# Patient Record
Sex: Female | Born: 1955 | Race: Black or African American | Hispanic: No | Marital: Single | Smoking: Former smoker
Health system: Southern US, Community
[De-identification: ages and names within clinical notes are randomized; demographics above are authoritative.]

---

## 2019-03-08 ENCOUNTER — Emergency Department (INDEPENDENT_AMBULATORY_CARE_PROVIDER_SITE_OTHER): Payer: PRIVATE HEALTH INSURANCE

## 2019-03-08 ENCOUNTER — Other Ambulatory Visit: Payer: Self-pay

## 2019-03-08 ENCOUNTER — Emergency Department (INDEPENDENT_AMBULATORY_CARE_PROVIDER_SITE_OTHER)
Admission: EM | Admit: 2019-03-08 | Discharge: 2019-03-08 | Disposition: A | Discharge status: Home / Self Care | Payer: PRIVATE HEALTH INSURANCE | Source: Home / Self Care | Attending: Family Medicine | Admitting: Family Medicine

## 2019-03-08 DIAGNOSIS — S52502A Unspecified fracture of the lower end of left radius, initial encounter for closed fracture: Secondary | ICD-10-CM

## 2019-03-08 DIAGNOSIS — W19XXXA Unspecified fall, initial encounter: Secondary | ICD-10-CM | POA: Diagnosis not present
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
## 2019-03-08 NOTE — Discharge Instructions (Addendum)
Wear splint.  Elevate arm/wrist.  Apply ice pack for 20 to 30 minutes, 3 to 4 times daily.  May take Tylenol as needed for pain.

## 2019-03-10 ENCOUNTER — Ambulatory Visit (INDEPENDENT_AMBULATORY_CARE_PROVIDER_SITE_OTHER): Payer: PRIVATE HEALTH INSURANCE | Admitting: Sports Medicine

## 2019-03-10 ENCOUNTER — Encounter: Payer: Self-pay | Admitting: Sports Medicine

## 2019-03-10 ENCOUNTER — Other Ambulatory Visit: Payer: Self-pay

## 2019-03-10 DIAGNOSIS — S52552A Other extraarticular fracture of lower end of left radius, initial encounter for closed fracture: Secondary | ICD-10-CM | POA: Diagnosis not present
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
## 2019-03-10 NOTE — Assessment & Plan Note (Signed)
Transitioning into an XO's cast. She does have some swelling so we may need to remove this at a follow-up visit. Return to see me in 1 week, repeat x-rays before visit.  I billed a fracture code for this encounter, all subsequent visits will be post-op checks in the global period.

## 2019-03-17 ENCOUNTER — Other Ambulatory Visit: Payer: Self-pay

## 2019-03-17 ENCOUNTER — Encounter: Payer: Self-pay | Admitting: Family Medicine

## 2019-03-17 ENCOUNTER — Ambulatory Visit (INDEPENDENT_AMBULATORY_CARE_PROVIDER_SITE_OTHER): Payer: PRIVATE HEALTH INSURANCE

## 2019-03-17 ENCOUNTER — Ambulatory Visit (INDEPENDENT_AMBULATORY_CARE_PROVIDER_SITE_OTHER): Payer: PRIVATE HEALTH INSURANCE | Admitting: Family Medicine

## 2019-03-17 VITALS — BP 158/79 | HR 78 | Temp 98.2°F | Ht 67.0 in | Wt 162.0 lb

## 2019-03-17 DIAGNOSIS — S52552A Other extraarticular fracture of lower end of left radius, initial encounter for closed fracture: Secondary | ICD-10-CM

## 2019-03-17 DIAGNOSIS — Z780 Asymptomatic menopausal state: Secondary | ICD-10-CM
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
## 2019-03-17 NOTE — Patient Instructions (Addendum)
Thank you for coming in today. Continue the exos cast.  Recheck in 2 weeks with Dr T or myself.  Schedule bone density test prior to the scheduled follow up appointment.  Usually 1 hour is plenty.  Get xray prior to next visit as well.   Phone number for xray scheduling is [phone]

## 2019-04-01 ENCOUNTER — Ambulatory Visit (INDEPENDENT_AMBULATORY_CARE_PROVIDER_SITE_OTHER): Payer: PRIVATE HEALTH INSURANCE

## 2019-04-01 ENCOUNTER — Other Ambulatory Visit: Payer: Self-pay

## 2019-04-01 ENCOUNTER — Ambulatory Visit (INDEPENDENT_AMBULATORY_CARE_PROVIDER_SITE_OTHER): Payer: PRIVATE HEALTH INSURANCE | Admitting: Sports Medicine

## 2019-04-01 DIAGNOSIS — M810 Age-related osteoporosis without current pathological fracture: Secondary | ICD-10-CM

## 2019-04-01 DIAGNOSIS — Z780 Asymptomatic menopausal state: Secondary | ICD-10-CM

## 2019-04-01 DIAGNOSIS — S52552A Other extraarticular fracture of lower end of left radius, initial encounter for closed fracture: Secondary | ICD-10-CM | POA: Diagnosis not present

## 2019-04-01 NOTE — Assessment & Plan Note (Signed)
T score of -2.3. Combined with the fracture I would say this is low enough to diagnose her with osteoporosis, we are going to see if she be okay starting Fosamax.

## 2019-04-01 NOTE — Progress Notes (Addendum)
  Subjective: 3 weeks post left distal radius fracture, doing well.  Objective: General: Well-developed, well-nourished, and in no acute distress. Left wrist: Swollen, no longer tender, good motion, good strength.  X-rays reviewed, there is minimal comminution in the distal radius fracture, the radial height has settled a bit however she continues to have good volar tilt and inclination.  Assessment/plan:   Closed extraarticular fracture of distal radius, left, initial encounter Doing well, return in 3 weeks. Continue EXOS cast until then. X-rays before visit. At the 3-week point we will discontinue the cast and get her working on range of motion exercises.  Osteoporosis T score of -2.3. Combined with the fracture I would say this is low enough to diagnose her with osteoporosis, we are going to see if she be okay starting Fosamax.    ___________________________________________ Gwen Her. Dianah Field, M.D., ABFM., CAQSM. Primary Care and Sports Medicine [facility]  Adjunct Professor of [place] [employer]

## 2019-04-01 NOTE — Assessment & Plan Note (Signed)
Doing well, return in 3 weeks. Continue EXOS cast until then. X-rays before visit. At the 3-week point we will discontinue the cast and get her working on range of motion exercises.

## 2019-04-03 MED ORDER — ALENDRONATE SODIUM 70 MG PO TABS: 70.0000 mg | ORAL_TABLET | ORAL | 11 refills | Status: DC

## 2019-04-03 NOTE — Addendum Note (Signed)
Addended by: Silverio Decamp on: 04/03/2019 08:45 AM   Modules accepted: Orders

## 2019-04-22 ENCOUNTER — Encounter: Payer: Self-pay | Admitting: Sports Medicine

## 2019-04-22 ENCOUNTER — Ambulatory Visit (INDEPENDENT_AMBULATORY_CARE_PROVIDER_SITE_OTHER): Payer: PRIVATE HEALTH INSURANCE

## 2019-04-22 ENCOUNTER — Ambulatory Visit (INDEPENDENT_AMBULATORY_CARE_PROVIDER_SITE_OTHER): Payer: PRIVATE HEALTH INSURANCE | Admitting: Sports Medicine

## 2019-04-22 ENCOUNTER — Other Ambulatory Visit: Payer: Self-pay

## 2019-04-22 DIAGNOSIS — S52552A Other extraarticular fracture of lower end of left radius, initial encounter for closed fracture: Secondary | ICD-10-CM

## 2019-04-22 DIAGNOSIS — M810 Age-related osteoporosis without current pathological fracture: Secondary | ICD-10-CM

## 2019-04-22 MED ORDER — ALENDRONATE SODIUM 70 MG PO TABS: 70.0000 mg | ORAL_TABLET | ORAL | 11 refills | Status: DC

## 2019-04-22 NOTE — Progress Notes (Signed)
  Subjective: 6 weeks post fracture, doing well.  Objective: General: Well-developed, well-nourished, and in no acute distress. Left wrist: No longer tender over the fracture, still somewhat swollen and expected loss of range of motion.  Assessment/plan:   Closed extraarticular fracture of distal radius, left, initial encounter Now approximately 6 weeks post fracture, there was a bit of loss of radial height but inclination and volar tilt were still good. Clinically she looks good, no pain over the fracture, discontinue cast. Adding formal physical therapy. Return to see me in 4 weeks.    ___________________________________________ Gwen Her. Dianah Field, M.D., ABFM., CAQSM. Primary Care and Sports Medicine [facility]  Adjunct Professor of [place] [employer]

## 2019-04-22 NOTE — Assessment & Plan Note (Signed)
Now approximately 6 weeks post fracture, there was a bit of loss of radial height but inclination and volar tilt were still good. Clinically she looks good, no pain over the fracture, discontinue cast. Adding formal physical therapy. Return to see me in 4 weeks.

## 2019-05-06 ENCOUNTER — Ambulatory Visit (INDEPENDENT_AMBULATORY_CARE_PROVIDER_SITE_OTHER): Payer: PRIVATE HEALTH INSURANCE | Admitting: Physical Therapy

## 2019-05-06 ENCOUNTER — Encounter: Payer: Self-pay | Admitting: Physical Therapy

## 2019-05-06 ENCOUNTER — Other Ambulatory Visit: Payer: Self-pay

## 2019-05-06 DIAGNOSIS — M25632 Stiffness of left wrist, not elsewhere classified: Secondary | ICD-10-CM

## 2019-05-06 DIAGNOSIS — M25532 Pain in left wrist: Secondary | ICD-10-CM | POA: Diagnosis not present

## 2019-05-06 NOTE — Patient Instructions (Signed)
Access Code: [email or web address]  URL: [email or web address]  Date: 05/06/2019  Prepared by: Faustino Congress   Exercises  Seated Wrist Flexion Stretch - 3 reps - 1 sets - 30 sec hold - 2x daily - 7x weekly  Wrist Prayer Stretch - 3 reps - 1 sets - 30 sec hold - 2x daily - 7x weekly  Seated Wrist Flexion with Overpressure - 3 reps - 1 sets - 30 sec hold - 2x daily - 7x weekly  Standing Wrist Flexion Stretch - 3 reps - 1 sets - 30 sec hold - 2x daily - 7x weekly  Reverse Prayer Stretch - 3 reps - 1 sets - 30 sec hold - 2x daily - 7x weekly  Wrist Extension with Resistance - 10 reps - 1 sets - 1x daily - 7x weekly
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
## 2019-05-20 ENCOUNTER — Encounter: Payer: Self-pay | Admitting: Physical Therapy

## 2019-05-20 ENCOUNTER — Ambulatory Visit (INDEPENDENT_AMBULATORY_CARE_PROVIDER_SITE_OTHER): Payer: PRIVATE HEALTH INSURANCE | Admitting: Physical Therapy

## 2019-05-20 ENCOUNTER — Other Ambulatory Visit: Payer: Self-pay

## 2019-05-20 DIAGNOSIS — M25532 Pain in left wrist: Secondary | ICD-10-CM | POA: Diagnosis not present

## 2019-05-20 DIAGNOSIS — M25632 Stiffness of left wrist, not elsewhere classified: Secondary | ICD-10-CM | POA: Diagnosis not present

## 2019-05-20 NOTE — Therapy (Signed)
[place], [place], [address] Phone: [phone]   Fax:  [phone]  Physical Therapy Treatment  Patient Details  Name: Deborah Lloyd MRN: [id number] Date of Birth: [birth year] Referring Provider (PT): Silverio Decamp, MD   Encounter Date: 05/20/2019  PT End of Session - 05/20/19 1513    Visit Number  2    Number of Visits  4    Date for PT Re-Evaluation  07/01/19    Authorization - Visit Number  2    Authorization - Number of Visits  4    PT Start Time  1430    PT Stop Time  1510    PT Time Calculation (min)  40 min    Activity Tolerance  Patient tolerated treatment well    Behavior During Therapy  [facility] for tasks assessed/performed       History reviewed. No pertinent past medical history.  History reviewed. No pertinent surgical history.  There were no vitals filed for this visit.  Subjective Assessment - 05/20/19 1432    Subjective  feels she's doing better; range is a little better, pain much better.    Patient Stated Goals  improve ROM, golfing, biking    Pain Score  0-No pain         [facility] PT Assessment - 05/20/19 1505      Assessment   Medical Diagnosis  S52.552A (ICD-10-CM) - Closed extraarticular fracture of distal radius, left, initial encounter    Referring Provider (PT)  Silverio Decamp, MD    Onset Date/Surgical Date  03/07/19      PROM   Left Wrist Extension  65 Degrees    Left Wrist Flexion  50 Degrees                   [facility] Adult PT Treatment/Exercise - 05/20/19 1457      Wrist Exercises   Wrist Flexion  20 reps;Left;Seated;Bar weights/barbell    Bar Weights/Barbell (Wrist Flexion)  3 lbs    Wrist Extension  Left;20 reps;Seated;Bar weights/barbell    Bar Weights/Barbell (Wrist Extension)  3 lbs    Wrist Radial Deviation  Left;20 reps;Seated;Bar weights/barbell    Bar Weights/Barbell (Radial Deviation)  3 lbs    Other wrist exercises   pronation/supination 3# x 20; left    Other wrist exercises  prayer stretch 3x30 sec      Manual Therapy   Manual Therapy  Joint mobilization;Passive ROM    Joint Mobilization  Lt wrist carpal mobs; radius/ulna grades 2-3     Passive ROM  Lt wrist flex/ext and pronation/supination             PT Education - 05/20/19 1512    Education Details  strengthening HEP    Person(s) Educated  Patient    Methods  Explanation;Demonstration;Handout    Comprehension  Verbalized understanding;Returned demonstration          PT Long Term Goals - 05/07/19 0743      PT LONG TERM GOAL #1   Title  independent with HEP    Status  New    Target Date  07/02/19      PT LONG TERM GOAL #2   Title  improve Lt wrist active flexion and extension by 10 degrees each direction for improved function    Status  New    Target Date  07/02/19      PT LONG TERM GOAL #3   Title  improve  Lt grip strength to at least 57# for improved strength and function    Status  New    Target Date  07/02/19      PT LONG TERM GOAL #4   Title  FOTO score improved to </= 22% limited for improved function    Status  New    Target Date  07/02/19            Plan - 05/20/19 1513    Clinical Impression Statement  Pt with improvement in PROM today (active not formally measured) and progressed HEP today to add strengthening.  Progressing well with PT despite visit limitations.    Personal Factors and Comorbidities  Comorbidity 2    Comorbidities  hx lymphoma, osteoporosis    Examination-Activity Limitations  Carry    Examination-Participation Restrictions  Community Activity    Stability/Clinical Decision Making  Stable/Uncomplicated    Rehab Potential  Good    PT Frequency  Biweekly   will increase freq if insurance allows more visits; or pt willing to self pay   PT Duration  8 weeks    PT Treatment/Interventions  ADLs/Self Care Home Management;Cryotherapy;Electrical Stimulation;Ultrasound;Moist Heat;Functional  mobility training;Therapeutic activities;Therapeutic exercise;Patient/family education;Manual techniques;Passive range of motion;Taping;Dry needling    PT Next Visit Plan  review HEP and continue with aggressive ROM; add strengthening to HEP    PT Home Exercise Plan  Access Code: [email or web address]    Consulted and Agree with Plan of Care  Patient       Patient will benefit from skilled therapeutic intervention in order to improve the following deficits and impairments:  Decreased range of motion, Decreased strength, Hypomobility  Visit Diagnosis: Stiffness of left wrist, not elsewhere classified  Pain in left wrist     Problem List Patient Active Problem List   Diagnosis Date Noted  . Osteoporosis 04/01/2019  . Closed extraarticular fracture of distal radius, left, initial encounter 03/10/2019        Clarita Crane, PT, DPT 05/20/19 3:15 PM     [facility] [place] [address] [place], [place], [address] Phone: [phone]   Fax:  [phone]  Name: Deborah Lloyd MRN: [id number] Date of Birth: 1956-04-03

## 2019-05-20 NOTE — Patient Instructions (Signed)
Access Code: [email or web address]  URL: [email or web address]  Date: 05/20/2019  Prepared by: Faustino Congress   Exercises  Seated Wrist Flexion Stretch - 3 reps - 1 sets - 30 sec hold - 2x daily - 7x weekly  Wrist Prayer Stretch - 3 reps - 1 sets - 30 sec hold - 2x daily - 7x weekly  Seated Wrist Flexion with Overpressure - 3 reps - 1 sets - 30 sec hold - 2x daily - 7x weekly  Standing Wrist Flexion Stretch - 3 reps - 1 sets - 30 sec hold - 2x daily - 7x weekly  Reverse Prayer Stretch - 3 reps - 1 sets - 30 sec hold - 2x daily - 7x weekly  Wrist Extension with Resistance - 10 reps - 1 sets - 1x daily - 7x weekly  Wrist Flexion with Dumbbell - 20 reps - 1 sets - 1x daily - 7x weekly  Seated Wrist Extension with Dumbbell - 20 reps - 1 sets - 1x daily - 7x weekly  Seated Pronation Supination with Dumbbell - 20 reps - 1 sets - 1x daily - 7x weekly  Seated Wrist Radial Deviation with Dumbbell - 20 reps - 1 sets - 1x daily - 7x weekly

## 2019-05-22 ENCOUNTER — Other Ambulatory Visit: Payer: Self-pay

## 2019-05-22 ENCOUNTER — Encounter: Payer: Self-pay | Admitting: Sports Medicine

## 2019-05-22 ENCOUNTER — Ambulatory Visit (INDEPENDENT_AMBULATORY_CARE_PROVIDER_SITE_OTHER): Payer: PRIVATE HEALTH INSURANCE | Admitting: Sports Medicine

## 2019-05-22 DIAGNOSIS — M810 Age-related osteoporosis without current pathological fracture: Secondary | ICD-10-CM

## 2019-05-22 DIAGNOSIS — S52552A Other extraarticular fracture of lower end of left radius, initial encounter for closed fracture: Secondary | ICD-10-CM

## 2019-05-22 NOTE — Progress Notes (Signed)
  Subjective: Now 10 weeks post fracture of the left distal radius, she had a bit of radial settling.  Overall did well with physical therapy, range of motion is good, strength is good, she really does not have any pain, maybe a little bit of discomfort with terminal ulnar deviation of the wrist.   Objective: General: Well-developed, well-nourished, and in no acute distress. Left wrist: Inspection normal with no visible erythema or swelling. ROM smooth and normal with good flexion and extension and ulnar/radial deviation that is symmetrical with opposite wrist. Palpation is normal over metacarpals, navicular, lunate, and TFCC; tendons without tenderness/ swelling No snuffbox tenderness. No tenderness over [place]. Strength 5/5 in all directions without pain. Negative tinel's and phalens signs. Negative Finkelstein sign. Negative Watson's test.  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 Certainly if this becomes worse we can do an injection around the TFCC before even considering something like an ulnar shortening osteotomy.  Osteoporosis T score of -2.3, combined with her fracture we added the diagnosis of osteoporosis and she will continue Fosamax indefinitely.    ___________________________________________ Gwen Her. Dianah Field, M.D., ABFM., CAQSM. Primary Care and Sports Medicine [facility]  Adjunct Professor of [employer]

## 2019-05-22 NOTE — Assessment & Plan Note (Signed)
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 Certainly if this becomes worse we can do an injection around the TFCC before even considering something like an ulnar shortening osteotomy.

## 2019-05-22 NOTE — Assessment & Plan Note (Signed)
T score of -2.3, combined with her fracture we added the diagnosis of osteoporosis and she will continue Fosamax indefinitely.

## 2019-06-03 ENCOUNTER — Ambulatory Visit (INDEPENDENT_AMBULATORY_CARE_PROVIDER_SITE_OTHER): Payer: PRIVATE HEALTH INSURANCE | Admitting: Physical Therapy

## 2019-06-03 ENCOUNTER — Encounter: Payer: Self-pay | Admitting: Physical Therapy

## 2019-06-03 ENCOUNTER — Other Ambulatory Visit: Payer: Self-pay

## 2019-06-03 DIAGNOSIS — M25632 Stiffness of left wrist, not elsewhere classified: Secondary | ICD-10-CM | POA: Diagnosis not present

## 2019-06-03 DIAGNOSIS — M25532 Pain in left wrist: Secondary | ICD-10-CM | POA: Diagnosis not present

## 2019-06-03 NOTE — Therapy (Signed)
[place], [place], [address] Phone: [phone]   Fax:  [phone]  Physical Therapy Treatment/Discharge Summary  Patient Details  Name: Deborah Lloyd MRN: [id number] Date of Birth: April 25, 1956 Referring Provider (PT): Silverio Decamp, MD   Encounter Date: 06/03/2019  PT End of Session - 06/03/19 1454    Visit Number  3    Number of Visits  4    Date for PT Re-Evaluation  07/01/19    Authorization - Visit Number  2    Authorization - Number of Visits  4    PT Start Time  1430    PT Stop Time  1454    PT Time Calculation (min)  24 min    Activity Tolerance  Patient tolerated treatment well    Behavior During Therapy  [facility] for tasks assessed/performed       History reviewed. No pertinent past medical history.  History reviewed. No pertinent surgical history.  There were no vitals filed for this visit.  Subjective Assessment - 06/03/19 1431    Subjective  no pain; just a little discomfort with end ranges.  feels like she doesn't need any more PT after today.    Patient Stated Goals  improve ROM, golfing, biking    Currently in Pain?  No/denies         [facility] PT Assessment - 06/03/19 1438      Observation/Other Assessments   Focus on Therapeutic Outcomes (FOTO)   98 (2% limited)      AROM   Left Wrist Extension  60 Degrees    Left Wrist Flexion  47 Degrees      Strength   Left Hand Grip (lbs)  62.33   70, 59, 58                  [facility] Adult PT Treatment/Exercise - 06/03/19 1432      Exercises   Exercises  Wrist      Wrist Exercises   Other wrist exercises  prayer stretch 3x30 sec                  PT Long Term Goals - 06/03/19 1455      PT LONG TERM GOAL #1   Title  independent with HEP    Status  Achieved      PT LONG TERM GOAL #2   Title  improve Lt wrist active flexion and extension by 10 degrees each direction for improved function    Status  Achieved      PT LONG TERM GOAL #3   Title  improve Lt grip strength to at least 57# for improved strength and function    Status  Achieved      PT LONG TERM GOAL #4   Title  FOTO score improved to </= 22% limited for improved function    Status  Achieved            Plan - 06/03/19 1455    Clinical Impression Statement  Pt has met all goals and is able to verbalize current exercise program including shoulders and biceps/triceps.  Pt ready for d/c today.    Personal Factors and Comorbidities  Comorbidity 2    Comorbidities  hx lymphoma, osteoporosis    Examination-Activity Limitations  Carry    Examination-Participation Restrictions  Community Activity    Stability/Clinical Decision Making  Stable/Uncomplicated    Rehab Potential  Good    PT Frequency  Biweekly  will increase freq if insurance allows more visits; or pt willing to self pay   PT Duration  8 weeks    PT Treatment/Interventions  ADLs/Self Care Home Management;Cryotherapy;Electrical Stimulation;Ultrasound;Moist Heat;Functional mobility training;Therapeutic activities;Therapeutic exercise;Patient/family education;Manual techniques;Passive range of motion;Taping;Dry needling    PT Next Visit Plan  d/c PT today    PT Home Exercise Plan  Access Code: [email or web address]    Consulted and Agree with Plan of Care  Patient       Patient will benefit from skilled therapeutic intervention in order to improve the following deficits and impairments:  Decreased range of motion, Decreased strength, Hypomobility  Visit Diagnosis: Pain in left wrist  Stiffness of left wrist, not elsewhere classified     Problem List Patient Active Problem List   Diagnosis Date Noted  . Osteoporosis 04/01/2019  . Closed extraarticular fracture of distal radius, left, initial encounter 03/10/2019      Laureen Abrahams, PT, DPT 06/03/19 2:58 PM     [place], [place], [address] Phone: [phone]   Fax:  [phone]  Name: Madysin Crisp MRN: [id number] Date of Birth: [birth year]     PHYSICAL THERAPY DISCHARGE SUMMARY  Visits from Start of Care: 3  Current functional level related to goals / functional outcomes: See above   Remaining deficits: See above   Education / Equipment: HEP  Plan: Patient agrees to discharge.  Patient goals were met. Patient is being discharged due to meeting the stated rehab goals.  ?????          Laureen Abrahams, PT, DPT 06/03/19 2:59 PM  [place] [facility] [place] [address] [place], [place] [address]  [phone] (office) [phone] (fax)

## 2020-04-19 ENCOUNTER — Other Ambulatory Visit: Payer: Self-pay | Admitting: Sports Medicine

## 2020-04-19 DIAGNOSIS — M810 Age-related osteoporosis without current pathological fracture: Secondary | ICD-10-CM

## 2020-05-18 ENCOUNTER — Other Ambulatory Visit: Payer: Self-pay | Admitting: Sports Medicine

## 2020-05-18 DIAGNOSIS — M810 Age-related osteoporosis without current pathological fracture: Secondary | ICD-10-CM

## 2020-06-14 ENCOUNTER — Other Ambulatory Visit: Payer: Self-pay | Admitting: Sports Medicine

## 2020-06-14 DIAGNOSIS — M810 Age-related osteoporosis without current pathological fracture: Secondary | ICD-10-CM

## 2020-07-09 ENCOUNTER — Other Ambulatory Visit: Payer: Self-pay | Admitting: Sports Medicine

## 2020-07-09 DIAGNOSIS — M810 Age-related osteoporosis without current pathological fracture: Secondary | ICD-10-CM

## 2020-07-13 ENCOUNTER — Other Ambulatory Visit: Payer: Self-pay | Admitting: Sports Medicine

## 2020-07-13 DIAGNOSIS — M810 Age-related osteoporosis without current pathological fracture: Secondary | ICD-10-CM

## 2020-08-09 ENCOUNTER — Other Ambulatory Visit: Payer: Self-pay | Admitting: Sports Medicine

## 2020-08-09 DIAGNOSIS — M810 Age-related osteoporosis without current pathological fracture: Secondary | ICD-10-CM

## 2020-09-07 ENCOUNTER — Other Ambulatory Visit: Payer: Self-pay | Admitting: Sports Medicine

## 2020-09-07 DIAGNOSIS — M810 Age-related osteoporosis without current pathological fracture: Secondary | ICD-10-CM

## 2020-10-05 ENCOUNTER — Other Ambulatory Visit: Payer: Self-pay | Admitting: Sports Medicine

## 2020-10-05 DIAGNOSIS — M810 Age-related osteoporosis without current pathological fracture: Secondary | ICD-10-CM

## 2021-04-10 ENCOUNTER — Other Ambulatory Visit: Payer: Self-pay

## 2021-04-10 ENCOUNTER — Emergency Department
Admission: RE | Admit: 2021-04-10 | Discharge: 2021-04-10 | Disposition: A | Discharge status: Home / Self Care | Payer: Self-pay | Source: Ambulatory Visit

## 2021-04-10 VITALS — BP 134/83 | HR 91 | Temp 99.5°F | Resp 18 | Ht 66.0 in | Wt 158.0 lb

## 2021-04-10 DIAGNOSIS — R21 Rash and other nonspecific skin eruption: Secondary | ICD-10-CM

## 2021-04-10 DIAGNOSIS — K120 Recurrent oral aphthae: Secondary | ICD-10-CM

## 2021-04-10 MED ORDER — METHYLPREDNISOLONE ACETATE 80 MG/ML IJ SUSP
80.0000 mg | Freq: Once | INTRAMUSCULAR | Status: AC
  Administered 2021-04-10: 80 mg via INTRAMUSCULAR

## 2021-04-10 MED ORDER — METHYLPREDNISOLONE 4 MG PO TBPK: ORAL_TABLET | ORAL | 0 refills | Status: AC

## 2021-04-10 MED ORDER — FEXOFENADINE HCL 180 MG PO TABS: 180.0000 mg | ORAL_TABLET | Freq: Every day | ORAL | 0 refills | Status: AC

## 2021-04-10 MED ORDER — CHLORHEXIDINE GLUCONATE 0.12 % MT SOLN: 15.0000 mL | Freq: Two times a day (BID) | OROMUCOSAL | 0 refills | Status: AC

## 2021-04-10 NOTE — ED Provider Notes (Signed)
[facility]    CSN: [id number] Arrival date & time: 04/10/21  1357      History   Chief Complaint No chief complaint on file.   HPI Deborah Lloyd is a 65 y.o. female.   HPI 65 year old female presents with a rash of legs, mouth, back, trunk, and face for 3 days.  Denies contact with animals, new lotions, detergents, or make-up.  Reports rash is painful and has been taking Ibuprofen with little to no relief.  History reviewed. No pertinent past medical history.  Patient Active Problem List   Diagnosis Date Noted   Osteoporosis 04/01/2019   Closed extraarticular fracture of distal radius, left, initial encounter 03/10/2019    History reviewed. No pertinent surgical history.  OB History   No obstetric history on file.      Home Medications    Prior to Admission medications   Medication Sig Start Date End Date Taking? Authorizing Provider  chlorhexidine (PERIDEX) 0.12 % solution Use as directed 15 mLs in the mouth or throat 2 (two) times daily for 7 days. 04/10/21 04/17/21 Yes Trevor Iha, FNP  fexofenadine [facility] ALLERGY) 180 MG tablet Take 1 tablet (180 mg total) by mouth daily for 15 days. 04/10/21 04/25/21 Yes Trevor Iha, FNP  methylPREDNISolone (MEDROL DOSEPAK) 4 MG TBPK tablet Take as directed 04/10/21  Yes Trevor Iha, FNP  Multiple Vitamins-Minerals (MULTIVITAMIN WOMEN 50+ PO) Take by mouth.    [provider]    Family History Family History  Problem Relation Age of Onset   Hypertension Mother    Diabetes Father     Social History Social History   Tobacco Use   Smoking status: Former    Types: Cigarettes    Quit date: 08/20/2013    Years since quitting: 7.6   Smokeless tobacco: Never  Vaping Use   Vaping Use: Never used  Substance Use Topics   Alcohol use: Yes     Allergies   Patient has no known allergies.   Review of Systems Review of Systems  Skin:  Positive for rash.    Physical Exam Triage Vital Signs ED  Triage Vitals  Enc Vitals Group     BP 04/10/21 1424 134/83     Pulse Rate 04/10/21 1424 91     Resp 04/10/21 1424 18     Temp 04/10/21 1424 99.5 F (37.5 C)     Temp Source 04/10/21 1424 Oral     SpO2 04/10/21 1424 98 %     Weight 04/10/21 1427 158 lb (71.7 kg)     Height 04/10/21 1427 5\' 6"  (1.676 m)     Head Circumference --      Peak Flow --      Pain Score 04/10/21 1426 5     Pain Loc --      Pain Edu? --      Excl. in GC? --    No data found.  Updated Vital Signs BP 134/83 (BP Location: Right Arm)   Pulse 91   Temp 99.5 F (37.5 C) (Oral)   Resp 18   Ht 5\' 6"  (1.676 m)   Wt 158 lb (71.7 kg)   SpO2 98%   BMI 25.50 kg/m       Physical Exam Vitals and nursing note reviewed.  Constitutional:      General: She is not in acute distress.    Appearance: Normal appearance. She is normal weight. She is not ill-appearing.  HENT:     Head: Normocephalic  and atraumatic.     Nose: Nose normal.     Mouth/Throat:     Mouth: Mucous membranes are moist.     Pharynx: Oropharynx is clear.     Comments: Multiple erythematous emphasis ulcers noted of tongue, surrounding gingival mucosa, hard and soft palates Eyes:     Extraocular Movements: Extraocular movements intact.     Conjunctiva/sclera: Conjunctivae normal.     Pupils: Pupils are equal, round, and reactive to light.  Cardiovascular:     Rate and Rhythm: Normal rate and regular rhythm.     Pulses: Normal pulses.     Heart sounds: Normal heart sounds.  Pulmonary:     Effort: Pulmonary effort is normal.     Breath sounds: Normal breath sounds. No wheezing, rhonchi or rales.  Musculoskeletal:        General: Normal range of motion.     Cervical back: Normal range of motion and neck supple. No tenderness.  Lymphadenopathy:     Cervical: No cervical adenopathy.  Skin:    General: Skin is warm and dry.     Comments: Face (cheek right sided), mouth (gingival mucosa including soft and hard palate), abdomen, torso, back,  lower legs (anterior aspects): Erythematous maculopapular eruption, multiple mildly painful circular shaped lesions noted  Neurological:     General: No focal deficit present.     Mental Status: She is alert and oriented to person, place, and time. Mental status is at baseline.  Psychiatric:        Mood and Affect: Mood normal.        Behavior: Behavior normal.        Thought Content: Thought content normal.     UC Treatments / Results  Labs (all labs ordered are listed, but only abnormal results are displayed) Labs Reviewed - No data to display  EKG   Radiology No results found.  Procedures Procedures (including critical care time)  Medications Ordered in UC Medications  methylPREDNISolone acetate (DEPO-MEDROL) injection 80 mg (80 mg Intramuscular Given 04/10/21 1550)    Initial Impression / Assessment and Plan / UC Course  I have reviewed the triage vital signs and the nursing notes.  Pertinent labs & imaging results that were available during my care of the patient were reviewed by me and considered in my medical decision making (see chart for details).     MDM: 1.  Rash and nonspecific skin eruption-IM Depo-Medrol 80 mg given once in clinic today, Rx'd Medrol Dosepak and Allegra; 2. Aphthous ulcer-Rx Peridex. Advised/instructed patient to start Medrol dose pack tomorrow, Tuesday, 04/11/2021.  Advised patient to take medications with food to completion.  Advised patient to take Allegra daily with Medrol Dosepak to stabilize mast cells at skin surface for the next 7-10 days.  Advised/encouraged patient to keep Peridex/swish in mouth for 30 seconds prior to spitting out.  Advised/encouraged patient increase daily water intake while taking these medications.  Patient discharged home, hemodynamically stable. Final Clinical Impressions(s) / UC Diagnoses   Final diagnoses:  Rash and nonspecific skin eruption  Aphthous ulcer of mouth     Discharge Instructions       Advised/instructed patient to start Medrol dose pack tomorrow, Tuesday, 04/11/2021.  Advised patient to take medications with food to completion.  Advised patient to take Allegra daily with Medrol Dosepak to stabilize mast cells at skin surface for the next 7-10 days.  Advised/encouraged patient to keep Peridex/swish in mouth for 30 seconds prior to spitting out.  Advised/encouraged patient  increase daily water intake while taking these medications.     ED Prescriptions     Medication Sig Dispense Auth. Provider   methylPREDNISolone (MEDROL DOSEPAK) 4 MG TBPK tablet Take as directed 1 each Trevor Iha, FNP   chlorhexidine (PERIDEX) 0.12 % solution Use as directed 15 mLs in the mouth or throat 2 (two) times daily for 7 days. 210 mL Trevor Iha, FNP   fexofenadine [facility] ALLERGY) 180 MG tablet Take 1 tablet (180 mg total) by mouth daily for 15 days. 15 tablet Trevor Iha, FNP      PDMP not reviewed this encounter.   Trevor Iha, FNP 04/10/21 [phone]

## 2021-04-10 NOTE — ED Triage Notes (Addendum)
Rash on legs, in mouth, on back, trunk, face x 3 days. Denies new lotion, detergent, etc.Taking ibuprofen.Hurts, doesn't itch.

## 2021-04-10 NOTE — Discharge Instructions (Addendum)
Advised/instructed patient to start Medrol dose pack tomorrow, Tuesday, 04/11/2021.  Advised patient to take medications with food to completion.  Advised patient to take Allegra daily with Medrol Dosepak to stabilize mast cells at skin surface for the next 7-10 days.  Advised/encouraged patient to keep Peridex/swish in mouth for 30 seconds prior to spitting out.  Advised/encouraged patient increase daily water intake while taking these medications.

## 2021-04-13 IMAGING — DX LEFT WRIST - COMPLETE 3+ VIEW
4 series · 4 of 4 positions shown · non-contrast
Comparison: March 17, 2019

CLINICAL DATA: Recent distal radial fracture

EXAM:
LEFT WRIST - COMPLETE 3+ VIEW

[wrist pa]
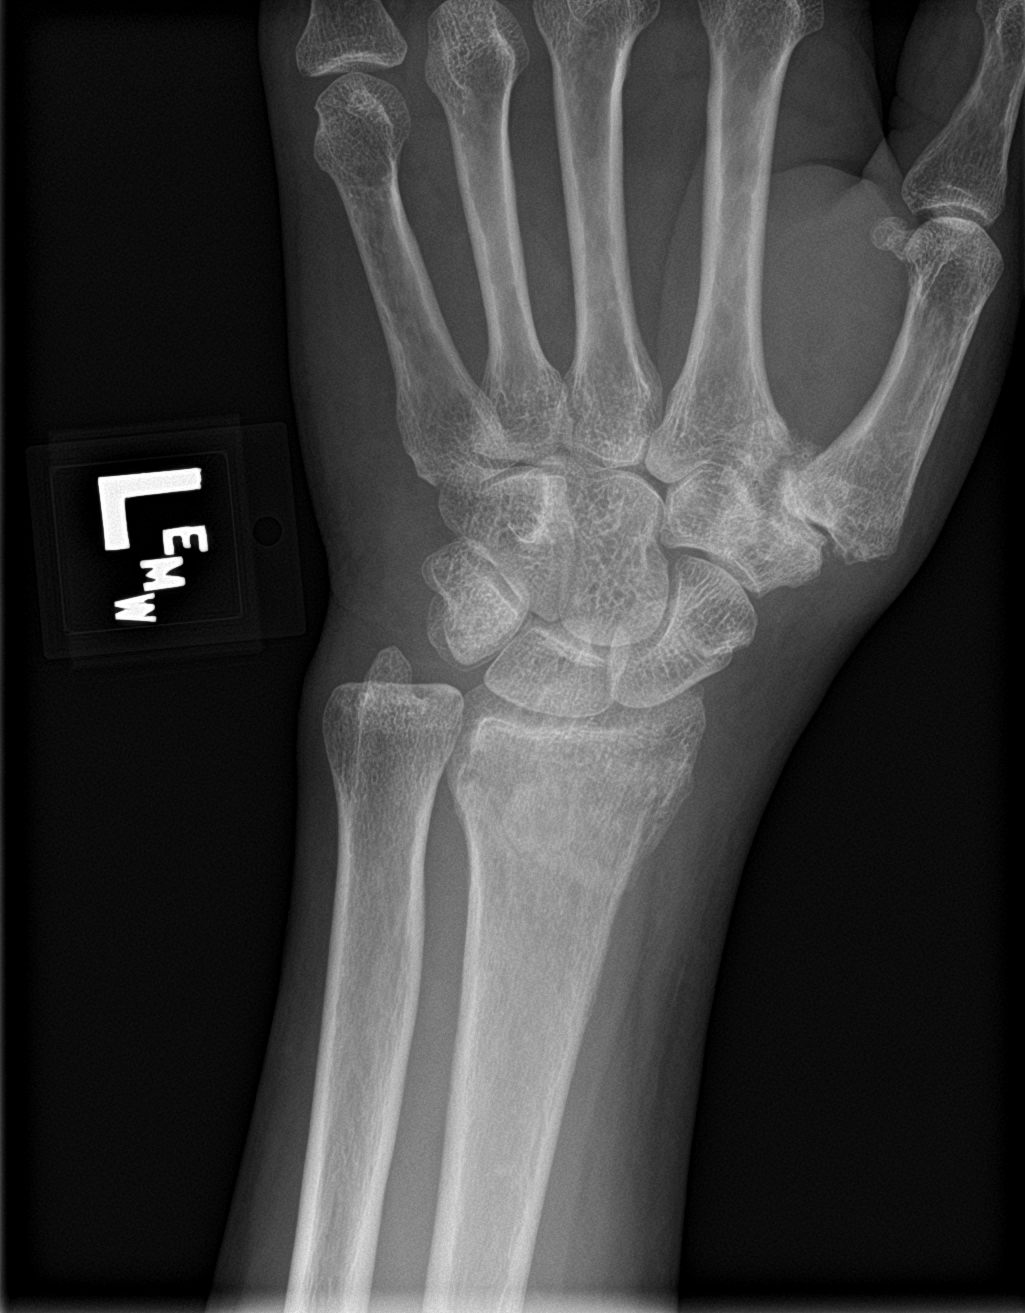

[wrist obl]
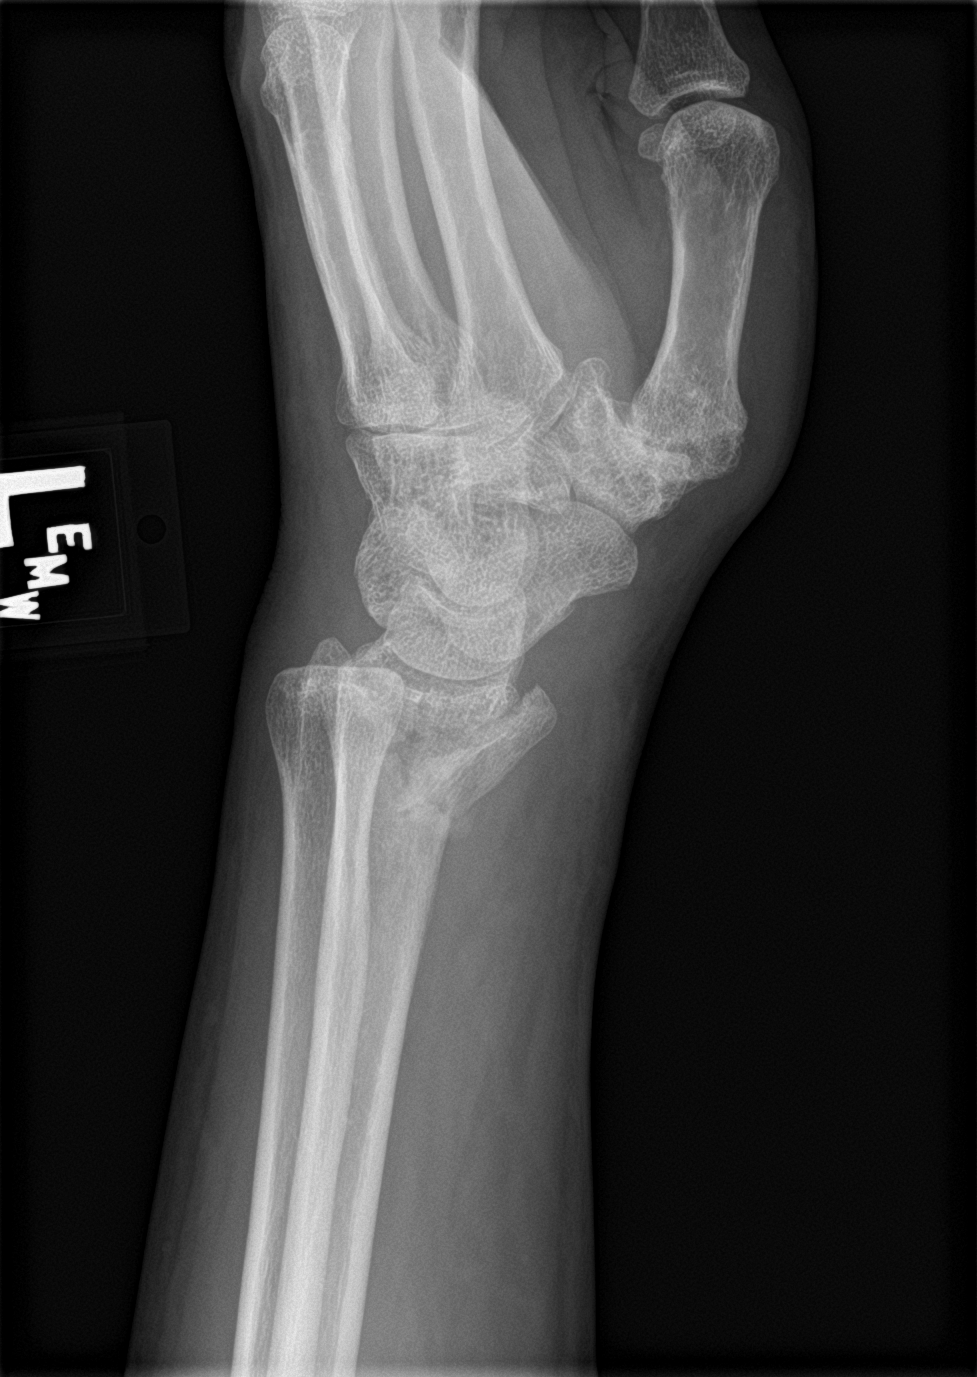

[wrist lat]
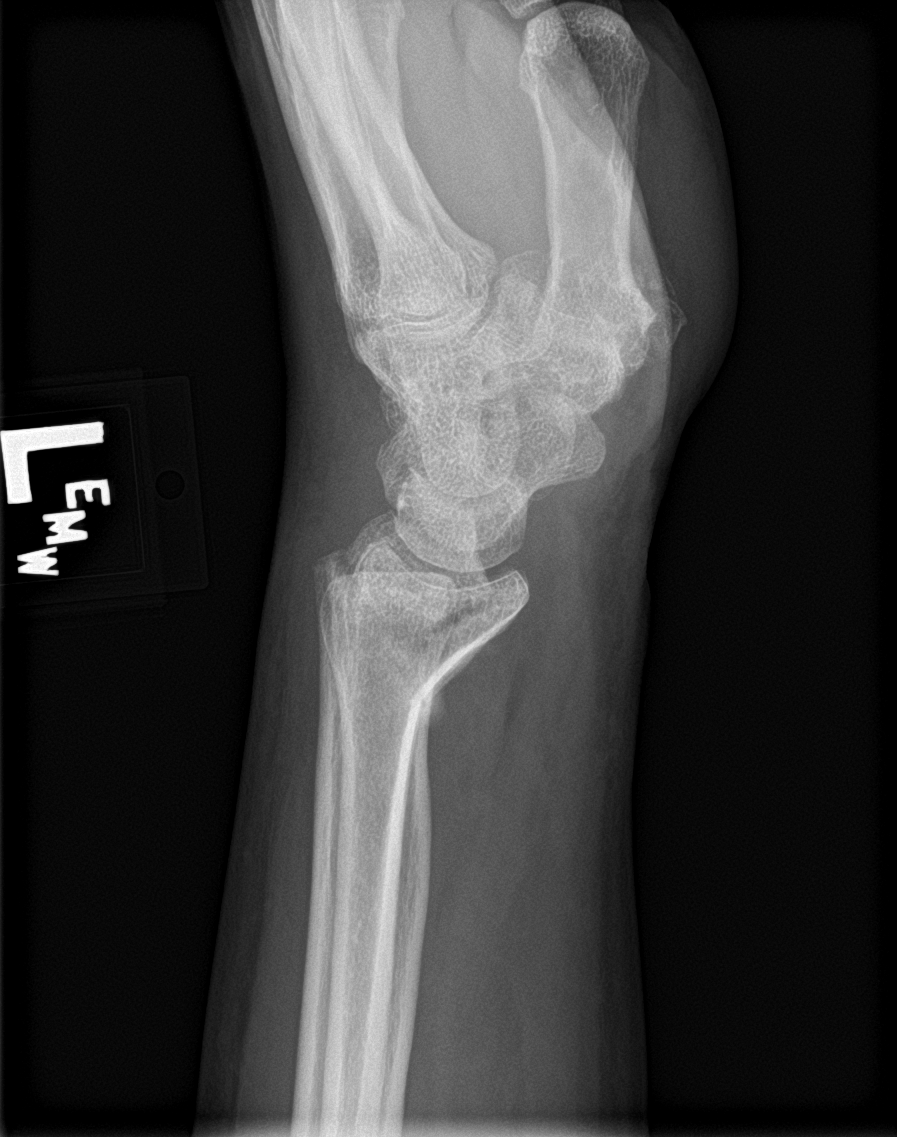

[wrist navicular]
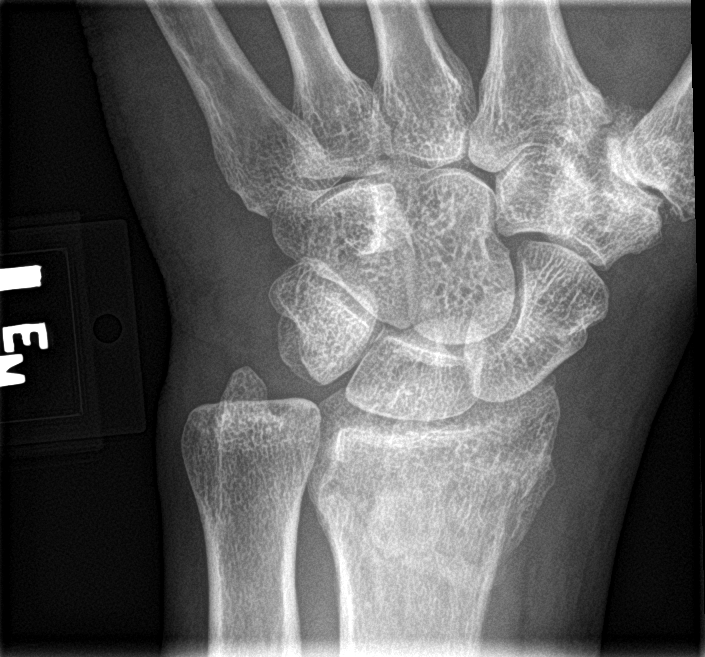

[4 of 4 positions shown; findings below may reference images not displayed]

FINDINGS: Frontal, oblique, lateral, and ulnar deviation scaphoid images
obtained. Comminuted fracture of the distal radius again noted.
There is a fracture fragment extending volar from the remainder of
the radius which shows increase in displacement compared to most
recent study. There is mild callus formation in this area of
fracture.

No other areas of fracture. No dislocation. There is fairly
extensive osteoarthritic change in the first carpal-metacarpal joint
with somewhat less osteoarthritis in the scaphotrapezial joint.
IMPRESSION: Comminuted fracture distal radial metaphysis. A fracture fragment
extends volar to the distal radius, more pronounced than on previous
study suggesting that there has been potential re-injury in this
area. No other fractures elsewhere. No dislocation.

Osteoarthritic change in the first carpal-metacarpal joint and to a
lesser extent in the scaphotrapezial joint.

These results will be called to the ordering clinician or
representative by the Radiologist Assistant, and communication
documented in the PACS or zVision Dashboard.
